# Patient Record
Sex: Male | Born: 1995 | Race: Black or African American | Hispanic: No | Marital: Single | Smoking: Never smoker
Health system: Southern US, Community
[De-identification: ages and names within clinical notes are randomized; demographics above are authoritative.]

## PROBLEM LIST (undated history)

## (undated) DIAGNOSIS — Z973 Presence of spectacles and contact lenses: Secondary | ICD-10-CM

## (undated) DIAGNOSIS — F840 Autistic disorder: Secondary | ICD-10-CM

## (undated) DIAGNOSIS — N200 Calculus of kidney: Secondary | ICD-10-CM

## (undated) HISTORY — DX: Autistic disorder: F84.0

## (undated) HISTORY — DX: Calculus of kidney: N20.0

## (undated) HISTORY — DX: Presence of spectacles and contact lenses: Z97.3

---
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
## 2013-01-12 ENCOUNTER — Ambulatory Visit (INDEPENDENT_AMBULATORY_CARE_PROVIDER_SITE_OTHER): Payer: Managed Care, Other (non HMO) | Admitting: Family Medicine

## 2013-01-12 DIAGNOSIS — Z23 Encounter for immunization: Secondary | ICD-10-CM

## 2014-05-25 ENCOUNTER — Telehealth: Payer: Self-pay | Admitting: Internal Medicine

## 2014-05-25 NOTE — Telephone Encounter (Signed)
yes

## 2014-05-25 NOTE — Telephone Encounter (Signed)
Pt's mom is needing to get pt scheduled for a phy, pt is in school and can only do fridays because he has to come from [place] and he has no classes,. Ok to use a well child slot?

## 2014-05-27 NOTE — Telephone Encounter (Signed)
Left msg for pt to call back and schedule appt

## 2014-05-28 NOTE — Telephone Encounter (Signed)
Pt is scheduled °

## 2014-07-29 ENCOUNTER — Encounter: Payer: Self-pay | Admitting: Internal Medicine

## 2014-07-29 ENCOUNTER — Ambulatory Visit (INDEPENDENT_AMBULATORY_CARE_PROVIDER_SITE_OTHER): Payer: Managed Care, Other (non HMO) | Admitting: Internal Medicine

## 2014-07-29 VITALS — BP 116/76 | Temp 98.3°F | Ht 69.5 in | Wt 164.0 lb

## 2014-07-29 DIAGNOSIS — Z23 Encounter for immunization: Secondary | ICD-10-CM

## 2014-07-29 DIAGNOSIS — Z0000 Encounter for general adult medical examination without abnormal findings: Secondary | ICD-10-CM

## 2014-07-29 NOTE — Patient Instructions (Signed)
  Continue lifestyle intervention healthy eating and exercise . Healthy lifestyle includes : At least 150 minutes of exercise weeks  , weight at healthy levels, which is usually   BMI 19-25. Avoid trans fats and processed foods;  Increase fresh fruits and veges to 5 servings per day. And avoid sweet beverages including tea and juice. Mediterranean diet with olive oil and nuts have been noted to be heart and brain healthy . Avoid tobacco products . Limit  alcohol   Get adequate sleep . Wear seatbelts  Done text and drive.

## 2014-07-29 NOTE — Progress Notes (Signed)
Pre visit review using our clinic review tool, if applicable. No additional management support is needed unless otherwise documented below in the visit note.   Chief Complaint  Patient presents with  . Annual Exam    HPI: Patient comes in today for Preventive Health Care visit   No major change in health status since last visit . 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 Tetanus/tdap  03/27/2013  . Influenza Vaccine  07/04/2015   Health Maintenance Review LIFESTYLE:  Exercise:  Not a lot  Tobacco/ETS:no Alcohol: per day no Sugar beverages: once a day.  Sleep: ok  Drug use: no Drivers license.  No sa   ROS:  GEN/ HEENT: No fever, significant weight changes sweats headaches vision problems hearing changes, CV/ PULM; No chest pain shortness of breath cough, syncope,edema  change in exercise tolerance. GI /GU: No adominal pain, vomiting, change in bowel habits. No blood in the stool. No significant GU symptoms. SKIN/HEME: ,no acute skin rashes suspicious lesions or bleeding. No lymphadenopathy, nodules, masses.  NEURO/ PSYCH:  No neurologic signs such as weakness numbness. No depression anxiety. IMM/ Allergy: No unusual infections.  Allergy .   REST of 12 system review negative except as per HPI   Past Medical History  Diagnosis Date  . Autistic spectrum disorder     prob  . Renal stone 2010    to ed (passed)  . Wears glasses     History reviewed. No pertinent family history.  History   Social History  . Marital Status: Single    Spouse Name: N/A    Number of Children: N/A  . Years of Education: N/A   Social History Main Topics  . Smoking status: Never Smoker   . Smokeless tobacco: None  . Alcohol Use: No  . Drug Use: No  . Sexual Activity: None   Other Topics Concern  . 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   No drivers license.    Neg tad no caffiene    Karate      Living in [place].    In college  [employer] [place] .       No outpatient encounter prescriptions on file as of 07/29/2014.    EXAM:  BP 116/76  Temp(Src) 98.3 F (36.8 C) (Oral)  Ht 5' 9.5" (1.765 m)  Wt 164 lb (74.39 kg)  BMI 23.88 kg/m2  Body mass index is 23.88 kg/(m^2).  Physical Exam: Vital signs reviewed ZOX:WRUE is a well-developed well-nourished alert cooperative    who appearsr stated age in no acute distress.  HEENT: normocephalic atraumatic , Eyes: PERRL EOM's full, conjunctiva clear, Nares: paten,t no deformity discharge or tenderness., Ears: no deformity EAC's 2+ wax TMs with normal landmarks. Mouth: clear OP, no lesions, edema.  Moist mucous membranes. Dentition in adequate repair. NECK: supple without masses, thyromegaly or bruits. CHEST/PULM:  Clear to auscultation and percussion breath sounds equal no wheeze , rales or rhonchi. No chest wall deformities or tenderness. CV: PMI is nondisplaced, S1 S2 no gallops, murmurs, rubs. Peripheral pulses are full without delay.No JVD .  ABDOMEN: Bowel sounds normal nontender  No guard or  rebound, no hepato splenomegal no CVA tenderness.  No hernia. Extremtities:  No clubbing cyanosis or edema, no acute joint swelling or redness no focal atrophy NEURO:  Oriented x3, cranial nerves 3-12 appear to be intact, no obvious focal weakness,gait within normal limits no abnormal reflexes or asymmetrical SKIN: No acute rashes normal turgor, color, no bruising or petechiae. PSYCH: Oriented, good eye contact, no obvious depression anxiety, cognition and judgment appear normal. LN: no cervical axillary inguinal adenopathy  Lab Results  Component Value Date   WBC 6.1 07/11/2012   HGB 15.1 07/11/2012   HCT 45.1 07/11/2012   PLT 203.0 07/11/2012   GLUCOSE 92 07/11/2012    CHOL 160 07/11/2012   TRIG 150.0* 07/11/2012   HDL 42.70 07/11/2012   LDLCALC 87 07/11/2012   ALT 28 07/11/2012   AST 32 07/11/2012   NA 139 07/11/2012   K 4.0 07/11/2012   CL 101 07/11/2012   CREATININE 0.9 07/11/2012   BUN 11 07/11/2012   CO2 28 07/11/2012   TSH 2.15 07/11/2012    ASSESSMENT AND PLAN:  Discussed the following assessment and plan:  Visit for preventive health examination - utd counsled flu vaccine today  Need for prophylactic vaccination and inoculation against influenza - Plan: Flu Vaccine QUAD 36+ mos PF IM (Fluarix Quad PF) ?s answered  Patient Care Team: Madelin Headings, MD as PCP - General Patient Instructions   Continue lifestyle intervention healthy eating and exercise . Healthy lifestyle includes : At least 150 minutes of exercise weeks  , weight at healthy levels, which is usually   BMI 19-25. Avoid trans fats and processed foods;  Increase fresh fruits and veges to 5 servings per day. And avoid sweet beverages including tea and juice. Mediterranean diet with olive oil and nuts have been noted to be heart and brain healthy . Avoid tobacco products . Limit  alcohol   Get adequate sleep . Wear seatbelts  Done text and drive.       Neta Mends. Panosh M.D.

## 2014-07-30 ENCOUNTER — Encounter: Payer: Managed Care, Other (non HMO) | Admitting: Internal Medicine

## 2016-01-18 ENCOUNTER — Ambulatory Visit (INDEPENDENT_AMBULATORY_CARE_PROVIDER_SITE_OTHER): Payer: Managed Care, Other (non HMO) | Admitting: Internal Medicine

## 2016-01-18 ENCOUNTER — Encounter: Payer: Self-pay | Admitting: Internal Medicine

## 2016-01-18 VITALS — BP 122/78 | Temp 98.1°F | Ht 69.0 in | Wt 180.6 lb

## 2016-01-18 DIAGNOSIS — Z0000 Encounter for general adult medical examination without abnormal findings: Secondary | ICD-10-CM | POA: Diagnosis not present

## 2016-01-18 LAB — TSH: TSH: 1.54 u[IU]/mL (ref 0.35–4.50)

## 2016-01-18 LAB — HEPATIC FUNCTION PANEL
ALBUMIN: 4.8 g/dL (ref 3.5–5.2)
ALK PHOS: 68 U/L (ref 39–117)
ALT: 17 U/L (ref 0–53)
AST: 19 U/L (ref 0–37)
Bilirubin, Direct: 0.1 mg/dL (ref 0.0–0.3)
TOTAL PROTEIN: 7.1 g/dL (ref 6.0–8.3)
Total Bilirubin: 0.5 mg/dL (ref 0.2–1.2)

## 2016-01-18 LAB — CBC WITH DIFFERENTIAL/PLATELET
BASOS ABS: 0 10*3/uL (ref 0.0–0.1)
Basophils Relative: 0.5 % (ref 0.0–3.0)
Eosinophils Absolute: 0.3 10*3/uL (ref 0.0–0.7)
Eosinophils Relative: 4.5 % (ref 0.0–5.0)
HEMATOCRIT: 44 % (ref 39.0–52.0)
HEMOGLOBIN: 14.8 g/dL (ref 13.0–17.0)
LYMPHS PCT: 36 % (ref 12.0–46.0)
Lymphs Abs: 2.2 10*3/uL (ref 0.7–4.0)
MCHC: 33.6 g/dL (ref 30.0–36.0)
MCV: 84.5 fl (ref 78.0–100.0)
MONOS PCT: 7 % (ref 3.0–12.0)
Monocytes Absolute: 0.4 10*3/uL (ref 0.1–1.0)
NEUTROS ABS: 3.2 10*3/uL (ref 1.4–7.7)
Neutrophils Relative %: 52 % (ref 43.0–77.0)
PLATELETS: 214 10*3/uL (ref 150.0–400.0)
RBC: 5.22 Mil/uL (ref 4.22–5.81)
RDW: 13.2 % (ref 11.5–15.5)
WBC: 6.1 10*3/uL (ref 4.0–10.5)

## 2016-01-18 LAB — LIPID PANEL
CHOL/HDL RATIO: 4
Cholesterol: 153 mg/dL (ref 0–200)
HDL: 35.9 mg/dL — ABNORMAL LOW (ref >39.00)
LDL Cholesterol: 97 mg/dL (ref 0–99)
NONHDL: 117.17
Triglycerides: 103 mg/dL (ref 0.0–149.0)
VLDL: 20.6 mg/dL (ref 0.0–40.0)

## 2016-01-18 LAB — BASIC METABOLIC PANEL
BUN: 12 mg/dL (ref 6–23)
CALCIUM: 9.7 mg/dL (ref 8.4–10.5)
CO2: 30 meq/L (ref 19–32)
Chloride: 103 mEq/L (ref 96–112)
Creatinine, Ser: 0.98 mg/dL (ref 0.40–1.50)
GFR: 123.22 mL/min (ref >60.00)
Glucose, Bld: 91 mg/dL (ref 70–99)
Potassium: 4.2 mEq/L (ref 3.5–5.1)
SODIUM: 139 meq/L (ref 135–145)

## 2016-01-18 NOTE — Progress Notes (Signed)
Pre visit review using our clinic review tool, if applicable. No additional management support is needed unless otherwise documented below in the visit note.  Chief Complaint  Patient presents with  . Annual Exam    HPI: Patient  Ricardo Parker  20 y.o. comes in today for Preventive Health Care visit  Now graduated from arts school [place] at home again. Health Maintenance  Topic Date Due  . HIV Screening  03/27/2009  . TETANUS/TDAP  03/27/2013  . INFLUENZA VACCINE  07/03/2016   Health Maintenance Review LIFESTYLE:  Exercise:   Works out  y  In weekdays   Tobacco/ETS: no Alcohol: rare Sugar beverages: not daily.  Sleep: 10-8  Drug use: no SA no  ROS:  GEN/ HEENT: No fever, significant weight changes sweats headaches vision problems hearing changes, CV/ PULM; No chest pain shortness of breath cough, syncope,edema  change in exercise tolerance. GI /GU: No adominal pain, vomiting, change in bowel habits. No blood in the stool. No significant GU symptoms. SKIN/HEME: ,no acute skin rashes suspicious lesions or bleeding. No lymphadenopathy, nodules, masses.  NEURO/ PSYCH:  No neurologic signs such as weakness numbness. No depression anxiety. IMM/ Allergy: No unusual infections.  Allergy .   REST of 12 system review negative except as per HPI   Past Medical History  Diagnosis Date  . Autistic spectrum disorder     prob  . Renal stone 2010    to ed (passed)  . Wears glasses     No past surgical history on file.  No family history on file.  Social History   Social History  . Marital Status: Single    Spouse Name: N/A  . Number of Children: N/A  . Years of Education: N/A   Social History Main Topics  . Smoking status: Never Smoker   . Smokeless tobacco: None  . Alcohol Use: No  . Drug Use: No  . Sexual Activity: Not Asked   Other Topics Concern  . None   Social History Narrative   HH of 5  Oldest child Father hx oxf deployed   Pet dog and fish   Sleep Ok some night   Wears glasses   [employer]  b and Chemical engineer.    drivers license.    Neg tad no caffiene    Karate      Living in [place].    In college  [employer] [place] .    Graduated    Now at home hh of 5   Works out 5 d per week 1.75  hours    No outpatient prescriptions prior to visit.   No facility-administered medications prior to visit.     EXAM:  BP 122/78 mmHg  Temp(Src) 98.1 F (36.7 C) (Oral)  Ht  (1.753 m)  Wt 180 lb 9.6 oz (81.92 kg)  BMI 26.66 kg/m2  Body mass index is 26.66 kg/(m^2).  Physical Exam: Vital signs reviewed ZOX:WRUE is a well-developed well-nourished alert cooperative    who appearsr stated age in no acute distress.  HEENT: normocephalic atraumatic , Eyes: PERRL EOM's full, conjunctiva clearglassess, Nares: paten,t no deformity discharge or tenderness., Ears: no deformity EAC's clear TMs with normal landmarks. Mouth: clear OP, no lesions, edema.  Moist mucous membranes. Dentition in adequate repair. NECK: supple without masses, thyromegaly or bruits. CHEST/PULM:  Clear to auscultation and percussion breath sounds equal no wheeze , rales or rhonchi. No chest wall deformities or tenderness. CV: PMI is nondisplaced, S1  S2 no gallops, murmurs, rubs. Peripheral pulses are full without delay.No JVD .  ABDOMEN: Bowel sounds normal nontender  No guard or rebound, no hepato splenomegal no CVA tenderness.  No hernia. Extremtities:  No clubbing cyanosis or edema, no acute joint swelling or redness no focal atrophy good muscle mass  NEURO:  Oriented x3, cranial nerves 3-12 appear to be intact, no obvious focal weakness,gait within normal limits no abnormal reflexes or asymmetrical SKIN: No acute rashes normal turgor, color, no bruising or petechiae. PSYCH: Oriented, good eye contact, no obvious depression anxiety, cognition and judgment appear normal. Slightly flat  Speech but interactive nl conversaion LN: no cervical axillary inguinal  adenopathy   ASSESSMENT AND PLAN:  Discussed the following assessment and plan:  Visit for preventive health examination - Plan: Basic metabolic panel, CBC with Differential/Platelet, Hepatic function panel, Lipid panel, TSH Disc lab today and if ok  Periodic  Lab  But Pv in a year  Counseled regarding healthy nutrition, exercise, sleep, injury prevention,  Patient Care Team: Madelin Headings, MD as PCP - General Patient Instructions   Continue lifestyle intervention healthy eating and exercise . Mediterranean diet is the healthiest. Will notify you  of labs when available. If all ok then yearly check up Health Maintenance, Male A healthy lifestyle and preventative care can promote health and wellness.  Maintain regular health, dental, and eye exams.  Eat a healthy diet. Foods like vegetables, fruits, whole grains, low-fat dairy products, and lean protein foods contain the nutrients you need and are low in calories. Decrease your intake of foods high in solid fats, added sugars, and salt. Get information about a proper diet from your health care provider, if necessary.  Regular physical exercise is one of the most important things you can do for your health. Most adults should get at least 150 minutes of moderate-intensity exercise (any activity that increases your heart rate and causes you to sweat) each week. In addition, most adults need muscle-strengthening exercises on 2 or more days a week.   Maintain a healthy weight. The body mass index (BMI) is a screening tool to identify possible weight problems. It provides an estimate of body fat based on height and weight. Your health care provider can find your BMI and can help you achieve or maintain a healthy weight. For males 20 years and older:  A BMI below 18.5 is considered underweight.  A BMI of 18.5 to 24.9 is normal.  A BMI of 25 to 29.9 is considered overweight.  A BMI of 30 and above is considered obese.  Maintain normal  blood lipids and cholesterol by exercising and minimizing your intake of saturated fat. Eat a balanced diet with plenty of fruits and vegetables. Blood tests for lipids and cholesterol should begin at age 72 and be repeated every 5 years. If your lipid or cholesterol levels are high, you are over age 53, or you are at high risk for heart disease, you may need your cholesterol levels checked more frequently.Ongoing high lipid and cholesterol levels should be treated with medicines if diet and exercise are not working.  If you smoke, find out from your health care provider how to quit. If you do not use tobacco, do not start.  Lung cancer screening is recommended for adults aged 55-80 years who are at high risk for developing lung cancer because of a history of smoking. A yearly low-dose CT scan of the lungs is recommended for people who have at least a  30-pack-year history of smoking and are current smokers or have quit within the past 15 years. A pack year of smoking is smoking an average of 1 pack of cigarettes a day for 1 year (for example, a 30-pack-year history of smoking could mean smoking 1 pack a day for 30 years or 2 packs a day for 15 years). Yearly screening should continue until the smoker has stopped smoking for at least 15 years. Yearly screening should be stopped for people who develop a health problem that would prevent them from having lung cancer treatment.  If you choose to drink alcohol, do not have more than 2 drinks per day. One drink is considered to be 12 oz (360 mL) of beer, 5 oz (150 mL) of wine, or 1.5 oz (45 mL) of liquor.  Avoid the use of street drugs. Do not share needles with anyone. Ask for help if you need support or instructions about stopping the use of drugs.  High blood pressure causes heart disease and increases the risk of stroke. High blood pressure is more likely to develop in:  People who have blood pressure in the end of the normal range (100-139/85-89 mm  Hg).  People who are overweight or obese.  People who are African American.  If you are 5-27 years of age, have your blood pressure checked every 3-5 years. If you are 16 years of age or older, have your blood pressure checked every year. You should have your blood pressure measured twice--once when you are at a hospital or clinic, and once when you are not at a hospital or clinic. Record the average of the two measurements. To check your blood pressure when you are not at a hospital or clinic, you can use:  An automated blood pressure machine at a pharmacy.  A home blood pressure monitor.  If you are 12-35 years old, ask your health care provider if you should take aspirin to prevent heart disease.  Diabetes screening involves taking a blood sample to check your fasting blood sugar level. This should be done once every 3 years after age 27 if you are at a normal weight and without risk factors for diabetes. Testing should be considered at a younger age or be carried out more frequently if you are overweight and have at least 1 risk factor for diabetes.  Colorectal cancer can be detected and often prevented. Most routine colorectal cancer screening begins at the age of 82 and continues through age 38. However, your health care provider may recommend screening at an earlier age if you have risk factors for colon cancer. On a yearly basis, your health care provider may provide home test kits to check for hidden blood in the stool. A small camera at the end of a tube may be used to directly examine the colon (sigmoidoscopy or colonoscopy) to detect the earliest forms of colorectal cancer. Talk to your health care provider about this at age 66 when routine screening begins. A direct exam of the colon should be repeated every 5-10 years through age 82, unless early forms of precancerous polyps or small growths are found.  People who are at an increased risk for hepatitis B should be screened for this  virus. You are considered at high risk for hepatitis B if:  You were born in a country where hepatitis B occurs often. Talk with your health care provider about which countries are considered high risk.  Your parents were born in a high-risk country and  you have not received a shot to protect against hepatitis B (hepatitis B vaccine).  You have HIV or AIDS.  You use needles to inject street drugs.  You live with, or have sex with, someone who has hepatitis B.  You are a man who has sex with other men (MSM).  You get hemodialysis treatment.  You take certain medicines for conditions like cancer, organ transplantation, and autoimmune conditions.  Hepatitis C blood testing is recommended for all people born from [birth year] through 1967 and any individual with known risk factors for hepatitis C.  Healthy men should no longer receive prostate-specific antigen (PSA) blood tests as part of routine cancer screening. Talk to your health care provider about prostate cancer screening.  Testicular cancer screening is not recommended for adolescents or adult males who have no symptoms. Screening includes self-exam, a health care provider exam, and other screening tests. Consult with your health care provider about any symptoms you have or any concerns you have about testicular cancer.  Practice safe sex. Use condoms and avoid high-risk sexual practices to reduce the spread of sexually transmitted infections (STIs).  You should be screened for STIs, including gonorrhea and chlamydia if:  You are sexually active and are younger than 24 years.  You are older than 24 years, and your health care provider tells you that you are at risk for this type of infection.  Your sexual activity has changed since you were last screened, and you are at an increased risk for chlamydia or gonorrhea. Ask your health care provider if you are at risk.  If you are at risk of being infected with HIV, it is recommended that  you take a prescription medicine daily to prevent HIV infection. This is called pre-exposure prophylaxis (PrEP). You are considered at risk if:  You are a man who has sex with other men (MSM).  You are a heterosexual man who is sexually active with multiple partners.  You take drugs by injection.  You are sexually active with a partner who has HIV.  Talk with your health care provider about whether you are at high risk of being infected with HIV. If you choose to begin PrEP, you should first be tested for HIV. You should then be tested every 3 months for as long as you are taking PrEP.  Use sunscreen. Apply sunscreen liberally and repeatedly throughout the day. You should seek shade when your shadow is shorter than you. Protect yourself by wearing long sleeves, pants, a wide-brimmed hat, and sunglasses year round whenever you are outdoors.  Tell your health care provider of new moles or changes in moles, especially if there is a change in shape or color. Also, tell your health care provider if a mole is larger than the size of a pencil eraser.  A one-time screening for abdominal aortic aneurysm (AAA) and surgical repair of large AAAs by ultrasound is recommended for men aged 65-75 years who are current or former smokers.  Stay current with your vaccines (immunizations).   This information is not intended to replace advice given to you by your health care provider. Make sure you discuss any questions you have with your health care provider.   Document Released: 05/17/2008 Document Revised: 12/10/2014 Document Reviewed: 04/16/2011 Elsevier Interactive Patient Education [employer]     [place]. Panosh M.D.

## 2016-01-18 NOTE — Patient Instructions (Addendum)
Continue lifestyle intervention healthy eating and exercise . Mediterranean diet is the healthiest. Will notify you  of labs when available. If all ok then yearly check up Health Maintenance, Male A healthy lifestyle and preventative care can promote health and wellness.  Maintain regular health, dental, and eye exams.  Eat a healthy diet. Foods like vegetables, fruits, whole grains, low-fat dairy products, and lean protein foods contain the nutrients you need and are low in calories. Decrease your intake of foods high in solid fats, added sugars, and salt. Get information about a proper diet from your health care provider, if necessary.  Regular physical exercise is one of the most important things you can do for your health. Most adults should get at least 150 minutes of moderate-intensity exercise (any activity that increases your heart rate and causes you to sweat) each week. In addition, most adults need muscle-strengthening exercises on 2 or more days a week.   Maintain a healthy weight. The body mass index (BMI) is a screening tool to identify possible weight problems. It provides an estimate of body fat based on height and weight. Your health care provider can find your BMI and can help you achieve or maintain a healthy weight. For males 20 years and older:  A BMI below 18.5 is considered underweight.  A BMI of 18.5 to 24.9 is normal.  A BMI of 25 to 29.9 is considered overweight.  A BMI of 30 and above is considered obese.  Maintain normal blood lipids and cholesterol by exercising and minimizing your intake of saturated fat. Eat a balanced diet with plenty of fruits and vegetables. Blood tests for lipids and cholesterol should begin at age 50 and be repeated every 5 years. If your lipid or cholesterol levels are high, you are over age 25, or you are at high risk for heart disease, you may need your cholesterol levels checked more frequently.Ongoing high lipid and cholesterol  levels should be treated with medicines if diet and exercise are not working.  If you smoke, find out from your health care provider how to quit. If you do not use tobacco, do not start.  Lung cancer screening is recommended for adults aged 55-80 years who are at high risk for developing lung cancer because of a history of smoking. A yearly low-dose CT scan of the lungs is recommended for people who have at least a 30-pack-year history of smoking and are current smokers or have quit within the past 15 years. A pack year of smoking is smoking an average of 1 pack of cigarettes a day for 1 year (for example, a 30-pack-year history of smoking could mean smoking 1 pack a day for 30 years or 2 packs a day for 15 years). Yearly screening should continue until the smoker has stopped smoking for at least 15 years. Yearly screening should be stopped for people who develop a health problem that would prevent them from having lung cancer treatment.  If you choose to drink alcohol, do not have more than 2 drinks per day. One drink is considered to be 12 oz (360 mL) of beer, 5 oz (150 mL) of wine, or 1.5 oz (45 mL) of liquor.  Avoid the use of street drugs. Do not share needles with anyone. Ask for help if you need support or instructions about stopping the use of drugs.  High blood pressure causes heart disease and increases the risk of stroke. High blood pressure is more likely to develop in:  People  who have blood pressure in the end of the normal range (100-139/85-89 mm Hg).  People who are overweight or obese.  People who are African American.  If you are 71-77 years of age, have your blood pressure checked every 3-5 years. If you are 57 years of age or older, have your blood pressure checked every year. You should have your blood pressure measured twice--once when you are at a hospital or clinic, and once when you are not at a hospital or clinic. Record the average of the two measurements. To check your  blood pressure when you are not at a hospital or clinic, you can use:  An automated blood pressure machine at a pharmacy.  A home blood pressure monitor.  If you are 92-24 years old, ask your health care provider if you should take aspirin to prevent heart disease.  Diabetes screening involves taking a blood sample to check your fasting blood sugar level. This should be done once every 3 years after age 26 if you are at a normal weight and without risk factors for diabetes. Testing should be considered at a younger age or be carried out more frequently if you are overweight and have at least 1 risk factor for diabetes.  Colorectal cancer can be detected and often prevented. Most routine colorectal cancer screening begins at the age of 58 and continues through age 83. However, your health care provider may recommend screening at an earlier age if you have risk factors for colon cancer. On a yearly basis, your health care provider may provide home test kits to check for hidden blood in the stool. A small camera at the end of a tube may be used to directly examine the colon (sigmoidoscopy or colonoscopy) to detect the earliest forms of colorectal cancer. Talk to your health care provider about this at age 66 when routine screening begins. A direct exam of the colon should be repeated every 5-10 years through age 30, unless early forms of precancerous polyps or small growths are found.  People who are at an increased risk for hepatitis B should be screened for this virus. You are considered at high risk for hepatitis B if:  You were born in a country where hepatitis B occurs often. Talk with your health care provider about which countries are considered high risk.  Your parents were born in a high-risk country and you have not received a shot to protect against hepatitis B (hepatitis B vaccine).  You have HIV or AIDS.  You use needles to inject street drugs.  You live with, or have sex with,  someone who has hepatitis B.  You are a man who has sex with other men (MSM).  You get hemodialysis treatment.  You take certain medicines for conditions like cancer, organ transplantation, and autoimmune conditions.  Hepatitis C blood testing is recommended for all people born from [birth year] through 1965 and any individual with known risk factors for hepatitis C.  Healthy men should no longer receive prostate-specific antigen (PSA) blood tests as part of routine cancer screening. Talk to your health care provider about prostate cancer screening.  Testicular cancer screening is not recommended for adolescents or adult males who have no symptoms. Screening includes self-exam, a health care provider exam, and other screening tests. Consult with your health care provider about any symptoms you have or any concerns you have about testicular cancer.  Practice safe sex. Use condoms and avoid high-risk sexual practices to reduce the spread of sexually  transmitted infections (STIs).  You should be screened for STIs, including gonorrhea and chlamydia if:  You are sexually active and are younger than 24 years.  You are older than 24 years, and your health care provider tells you that you are at risk for this type of infection.  Your sexual activity has changed since you were last screened, and you are at an increased risk for chlamydia or gonorrhea. Ask your health care provider if you are at risk.  If you are at risk of being infected with HIV, it is recommended that you take a prescription medicine daily to prevent HIV infection. This is called pre-exposure prophylaxis (PrEP). You are considered at risk if:  You are a man who has sex with other men (MSM).  You are a heterosexual man who is sexually active with multiple partners.  You take drugs by injection.  You are sexually active with a partner who has HIV.  Talk with your health care provider about whether you are at high risk of being  infected with HIV. If you choose to begin PrEP, you should first be tested for HIV. You should then be tested every 3 months for as long as you are taking PrEP.  Use sunscreen. Apply sunscreen liberally and repeatedly throughout the day. You should seek shade when your shadow is shorter than you. Protect yourself by wearing long sleeves, pants, a wide-brimmed hat, and sunglasses year round whenever you are outdoors.  Tell your health care provider of new moles or changes in moles, especially if there is a change in shape or color. Also, tell your health care provider if a mole is larger than the size of a pencil eraser.  A one-time screening for abdominal aortic aneurysm (AAA) and surgical repair of large AAAs by ultrasound is recommended for men aged 65-75 years who are current or former smokers.  Stay current with your vaccines (immunizations).   This information is not intended to replace advice given to you by your health care provider. Make sure you discuss any questions you have with your health care provider.   Document Released: 05/17/2008 Document Revised: 12/10/2014 Document Reviewed: 04/16/2011 Elsevier Interactive Patient Education [employer]2016 Elsevier Inc.

## 2018-03-05 ENCOUNTER — Encounter: Payer: Self-pay | Admitting: Physician Assistant

## 2018-03-18 ENCOUNTER — Ambulatory Visit: Payer: Self-pay | Admitting: Physician Assistant

## 2018-03-24 ENCOUNTER — Ambulatory Visit (INDEPENDENT_AMBULATORY_CARE_PROVIDER_SITE_OTHER): Payer: 59 | Admitting: Physician Assistant

## 2018-03-24 ENCOUNTER — Encounter: Payer: Self-pay | Admitting: Physician Assistant

## 2018-03-24 ENCOUNTER — Other Ambulatory Visit: Payer: Self-pay

## 2018-03-24 VITALS — BP 146/78 | HR 89 | Temp 98.1°F | Resp 16 | Ht 69.0 in | Wt 194.0 lb

## 2018-03-24 DIAGNOSIS — Z13228 Encounter for screening for other metabolic disorders: Secondary | ICD-10-CM

## 2018-03-24 DIAGNOSIS — Z130 Encounter for screening for diseases of the blood and blood-forming organs and certain disorders involving the immune mechanism: Secondary | ICD-10-CM | POA: Diagnosis not present

## 2018-03-24 DIAGNOSIS — Z0000 Encounter for general adult medical examination without abnormal findings: Secondary | ICD-10-CM

## 2018-03-24 DIAGNOSIS — Z113 Encounter for screening for infections with a predominantly sexual mode of transmission: Secondary | ICD-10-CM | POA: Diagnosis not present

## 2018-03-24 DIAGNOSIS — Z1329 Encounter for screening for other suspected endocrine disorder: Secondary | ICD-10-CM

## 2018-03-24 DIAGNOSIS — R350 Frequency of micturition: Secondary | ICD-10-CM

## 2018-03-24 LAB — POCT URINALYSIS DIP (MANUAL ENTRY)
Bilirubin, UA: NEGATIVE
Glucose, UA: NEGATIVE mg/dL
Ketones, POC UA: NEGATIVE mg/dL
Leukocytes, UA: NEGATIVE
NITRITE UA: NEGATIVE
PH UA: 7.5 (ref 5.0–8.0)
Protein Ur, POC: NEGATIVE mg/dL
RBC UA: NEGATIVE
Spec Grav, UA: 1.02 (ref 1.010–1.025)
Urobilinogen, UA: 1 E.U./dL

## 2018-03-24 NOTE — Progress Notes (Signed)
PRIMARY CARE AT [place], [place] [address] [phone]  Date:  03/24/2018   Name:  ZEDDIE NJIE   DOB:  12/27/1995   MRN:  [id number]  PCP:  Joretta Bachelor, PA    History of Present Illness:  Verdie Drown Rebuck is a 22 y.o. male patient who presents to PCP with  Chief Complaint  Patient presents with  . Annual Exam     DIET: meats, pastas.  He hydrates well with water.  He is eating vegetables.  No sodas.    BM: normal.  No known black or bloody stool.  No constipaiton or diarrhea.    URINATION: normal.  No dysuria, hematuria, but has some frequency  frequency.    SOCIAL ACTIVITY: he mostly makes short films, also enjoys writing scripts for the films.   Exercise: 3 times per week.  Basic and jogging, and weightlifting.   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  He likes his job.   EtOH: none Tobacco or vaping: none Illicit drug use: none Sexually active: not at this time.    Patient Active Problem List   Diagnosis Date Noted  . Head pain 07/14/2012  . Visit for preventive health examination 07/14/2012  . Autistic spectrum disorder   . Wears glasses   . 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 EPISTAXIS, RECURRENT 08/27/2008    Past Medical History:  Diagnosis Date  . Autistic spectrum disorder    prob  . Renal stone 2010   to ed (passed)  . Wears glasses     No past surgical history on file.  Social History   Tobacco Use  . Smoking status: Never Smoker  Substance Use Topics  . Alcohol use: No  . Drug use: No    No family history on file.  No Known Allergies  Medication list has been reviewed and updated.  No current outpatient medications on file prior to visit.   No current facility-administered medications on file prior to visit.     Review of Systems  Constitutional: Negative for chills and fever.  HENT: Negative for ear discharge, ear pain and sore throat.   Eyes: Negative for blurred vision and double  vision.  Respiratory: Negative for cough, shortness of breath and wheezing.   Cardiovascular: Negative for chest pain, palpitations and leg swelling.  Gastrointestinal: Negative for diarrhea, nausea and vomiting.  Genitourinary: Negative for dysuria, frequency and hematuria.  Skin: Negative for itching and rash.  Neurological: Negative for dizziness and headaches.   ROS otherwise unremarkable unless listed above.  Physical Examination: BP (!) 146/78   Pulse 89   Temp 98.1 F (36.7 C) (Oral)   Resp 16   Ht _0  (1.753 m)   Wt 194 lb (88 kg)   SpO2 100%   BMI 28.65 kg/m  Ideal Body Weight: Weight in (lb) to have BMI = 25: 168.9  Physical Exam  Constitutional: He is oriented to person, place, and time. He appears well-developed and well-nourished. No distress.  HENT:  Head: Normocephalic and atraumatic.  Right Ear: Tympanic membrane, external ear and ear canal normal.  Left Ear: Tympanic membrane, external ear and ear canal normal.  Eyes: Pupils are equal, round, and reactive to light. Conjunctivae and EOM are normal.  Cardiovascular: Normal rate and regular rhythm. Exam reveals no friction rub.  No murmur heard. Pulmonary/Chest: Effort normal. No respiratory distress. He has no wheezes.  Abdominal: Soft. Bowel sounds are normal. He exhibits no distension and  no mass. There is no tenderness.  Musculoskeletal: Normal range of motion. He exhibits no edema or tenderness.  Neurological: He is alert and oriented to person, place, and time. He displays normal reflexes.  Skin: Skin is warm and dry. He is not diaphoretic.  Psychiatric: He has a normal mood and affect. His behavior is normal.     Assessment and Plan: Verdie Drown Mccollom is a 22 y.o. male who is here today for cc of  Chief Complaint  Patient presents with  . Annual Exam  --urine normal Annual physical exam - Plan: CBC, CMP14+EGFR, TSH, HIV antibody, RPR  Frequency of urination - Plan: POCT urinalysis  dipstick  Screening for deficiency anemia - Plan: CBC  Screening for metabolic disorder - Plan: CMP14+EGFR  Screening for thyroid disorder - Plan: TSH  Screening for STD (sexually transmitted disease) - Plan: HIV antibody, RPR  Ivar Drape, PA-C [facility] 4/24/20197:52 AM

## 2018-03-25 ENCOUNTER — Encounter: Payer: Self-pay | Admitting: Urgent Care

## 2018-03-25 ENCOUNTER — Ambulatory Visit (INDEPENDENT_AMBULATORY_CARE_PROVIDER_SITE_OTHER): Payer: 59 | Admitting: Urgent Care

## 2018-03-25 VITALS — HR 70 | Resp 16

## 2018-03-25 DIAGNOSIS — R350 Frequency of micturition: Secondary | ICD-10-CM

## 2018-03-25 LAB — CMP14+EGFR
ALBUMIN: 4.9 g/dL (ref 3.5–5.5)
ALK PHOS: 73 IU/L (ref 39–117)
ALT: 38 IU/L (ref 0–44)
AST: 28 IU/L (ref 0–40)
Albumin/Globulin Ratio: 2 (ref 1.2–2.2)
BILIRUBIN TOTAL: 0.3 mg/dL (ref 0.0–1.2)
BUN / CREAT RATIO: 9 (ref 9–20)
BUN: 10 mg/dL (ref 6–20)
CHLORIDE: 99 mmol/L (ref 96–106)
CO2: 23 mmol/L (ref 20–29)
CREATININE: 1.07 mg/dL (ref 0.76–1.27)
Calcium: 10 mg/dL (ref 8.7–10.2)
GFR calc Af Amer: 113 mL/min/{1.73_m2} (ref >59)
GFR calc non Af Amer: 97 mL/min/{1.73_m2} (ref >59)
GLUCOSE: 91 mg/dL (ref 65–99)
Globulin, Total: 2.5 g/dL (ref 1.5–4.5)
Potassium: 3.9 mmol/L (ref 3.5–5.2)
Sodium: 141 mmol/L (ref 134–144)
Total Protein: 7.4 g/dL (ref 6.0–8.5)

## 2018-03-25 LAB — CBC
Hematocrit: 44.7 % (ref 37.5–51.0)
Hemoglobin: 15.2 g/dL (ref 13.0–17.7)
MCH: 27.9 pg (ref 26.6–33.0)
MCHC: 34 g/dL (ref 31.5–35.7)
MCV: 82 fL (ref 79–97)
PLATELETS: 267 10*3/uL (ref 150–379)
RBC: 5.45 x10E6/uL (ref 4.14–5.80)
RDW: 13 % (ref 12.3–15.4)
WBC: 7.6 10*3/uL (ref 3.4–10.8)

## 2018-03-25 LAB — RPR: RPR: NONREACTIVE

## 2018-03-25 LAB — TSH: TSH: 4.02 u[IU]/mL (ref 0.450–4.500)

## 2018-03-25 LAB — HIV ANTIBODY (ROUTINE TESTING W REFLEX): HIV SCREEN 4TH GENERATION: NONREACTIVE

## 2018-03-25 NOTE — Patient Instructions (Addendum)
Health Maintenance, Male A healthy lifestyle and preventive care is important for your health and wellness. Ask your health care provider about what schedule of regular examinations is right for you. What should I know about weight and diet? Eat a Healthy Diet  Eat plenty of vegetables, fruits, whole grains, low-fat dairy products, and lean protein.  Do not eat a lot of foods high in solid fats, added sugars, or salt.  Maintain a Healthy Weight Regular exercise can help you achieve or maintain a healthy weight. You should:  Do at least 150 minutes of exercise each week. The exercise should increase your heart rate and make you sweat (moderate-intensity exercise).  Do strength-training exercises at least twice a week.  Watch Your Levels of Cholesterol and Blood Lipids  Have your blood tested for lipids and cholesterol every 5 years starting at 24 years of age. If you are at high risk for heart disease, you should start having your blood tested when you are 24 years old. You may need to have your cholesterol levels checked more often if: ? Your lipid or cholesterol levels are high. ? You are older than 24 years of age. ? You are at high risk for heart disease.  What should I know about cancer screening? Many types of cancers can be detected early and may often be prevented. Lung Cancer  You should be screened every year for lung cancer if: ? You are a current smoker who has smoked for at least 30 years. ? You are a former smoker who has quit within the past 15 years.  Talk to your health care provider about your screening options, when you should start screening, and how often you should be screened.  Colorectal Cancer  Routine colorectal cancer screening usually begins at 24 years of age and should be repeated every 5-10 years until you are 24 years old. You may need to be screened more often if early forms of precancerous polyps or small growths are found. Your health care provider  may recommend screening at an earlier age if you have risk factors for colon cancer.  Your health care provider may recommend using home test kits to check for hidden blood in the stool.  A small camera at the end of a tube can be used to examine your colon (sigmoidoscopy or colonoscopy). This checks for the earliest forms of colorectal cancer.  Prostate and Testicular Cancer  Depending on your age and overall health, your health care provider may do certain tests to screen for prostate and testicular cancer.  Talk to your health care provider about any symptoms or concerns you have about testicular or prostate cancer.  Skin Cancer  Check your skin from head to toe regularly.  Tell your health care provider about any new moles or changes in moles, especially if: ? There is a change in a mole's size, shape, or color. ? You have a mole that is larger than a pencil eraser.  Always use sunscreen. Apply sunscreen liberally and repeat throughout the day.  Protect yourself by wearing long sleeves, pants, a wide-brimmed hat, and sunglasses when outside.  What should I know about heart disease, diabetes, and high blood pressure?  If you are 18-39 years of age, have your blood pressure checked every 3-5 years. If you are 40 years of age or older, have your blood pressure checked every year. You should have your blood pressure measured twice-once when you are at a hospital or clinic, and once when   you are not at a hospital or clinic. Record the average of the two measurements. To check your blood pressure when you are not at a hospital or clinic, you can use: ? An automated blood pressure machine at a pharmacy. ? A home blood pressure monitor.  Talk to your health care provider about your target blood pressure.  If you are between 45-79 years old, ask your health care provider if you should take aspirin to prevent heart disease.  Have regular diabetes screenings by checking your fasting blood  sugar level. ? If you are at a normal weight and have a low risk for diabetes, have this test once every three years after the age of 45. ? If you are overweight and have a high risk for diabetes, consider being tested at a younger age or more often.  A one-time screening for abdominal aortic aneurysm (AAA) by ultrasound is recommended for men aged 65-75 years who are current or former smokers. What should I know about preventing infection? Hepatitis B If you have a higher risk for hepatitis B, you should be screened for this virus. Talk with your health care provider to find out if you are at risk for hepatitis B infection. Hepatitis C Blood testing is recommended for:  Everyone born from 1945 through 1965.  Anyone with known risk factors for hepatitis C.  Sexually Transmitted Diseases (STDs)  You should be screened each year for STDs including gonorrhea and chlamydia if: ? You are sexually active and are younger than 24 years of age. ? You are older than 24 years of age and your health care provider tells you that you are at risk for this type of infection. ? Your sexual activity has changed since you were last screened and you are at an increased risk for chlamydia or gonorrhea. Ask your health care provider if you are at risk.  Talk with your health care provider about whether you are at high risk of being infected with HIV. Your health care provider may recommend a prescription medicine to help prevent HIV infection.  What else can I do?  Schedule regular health, dental, and eye exams.  Stay current with your vaccines (immunizations).  Do not use any tobacco products, such as cigarettes, chewing tobacco, and e-cigarettes. If you need help quitting, ask your health care provider.  Limit alcohol intake to no more than 2 drinks per day. One drink equals 12 ounces of beer, 5 ounces of wine, or 1 ounces of hard liquor.  Do not use street drugs.  Do not share needles.  Ask your  health care provider for help if you need support or information about quitting drugs.  Tell your health care provider if you often feel depressed.  Tell your health care provider if you have ever been abused or do not feel safe at home. This information is not intended to replace advice given to you by your health care provider. Make sure you discuss any questions you have with your health care provider. Document Released: 05/17/2008 Document Revised: 07/18/2016 Document Reviewed: 08/23/2015 Elsevier Interactive Patient Education  2018 Elsevier Inc.     IF you received an x-ray today, you will receive an invoice from [facility]. Please contact [facility] at [phone] with questions or concerns regarding your invoice.   IF you received labwork today, you will receive an invoice from [facility]. Please contact [facility] at [phone] with questions or concerns regarding your invoice.   Our billing staff will not be   able to assist you with questions regarding bills from these companies.  You will be contacted with the lab results as soon as they are available. The fastest way to get your results is to activate your My Chart account. Instructions are located on the last page of this paperwork. If you have not heard from us regarding the results in 2 weeks, please contact this office.       

## 2018-03-25 NOTE — Progress Notes (Signed)
MRN: [id number] DOB: [birth year]  Subjective:   Ricardo Parker is a 22 y.o. male presenting for follow up on lab results.  Patient believes that he was told by PA English to return today for follow-up.  Denies any concerns.  He did have an annual physical yesterday.  Ricardo Parker currently has no medications in their medication list. Also has No Known Allergies.  Ricardo Parker  has a past medical history of Autistic spectrum disorder, Renal stone (2010), and Wears glasses. Also  has no past surgical history on file.  Objective:   Vitals: Pulse 70   Resp 16   SpO2 99%   Physical Exam  Constitutional: He is oriented to person, place, and time. He appears well-developed and well-nourished.  Cardiovascular: Normal rate.  Pulmonary/Chest: Effort normal.  Neurological: He is alert and oriented to person, place, and time.    Results for orders placed or performed in visit on 03/24/18 (from the past 72 hour(s))  CBC     Status: None   Collection Time: 03/24/18  5:41 PM  Result Value Ref Range   WBC 7.6 3.4 - 10.8 x10E3/uL   RBC 5.45 4.14 - 5.80 x10E6/uL   Hemoglobin 15.2 13.0 - 17.7 g/dL   Hematocrit 44.7 37.5 - 51.0 %   MCV 82 79 - 97 fL   MCH 27.9 26.6 - 33.0 pg   MCHC 34.0 31.5 - 35.7 g/dL   RDW 13.0 12.3 - 15.4 %   Platelets 267 150 - 379 x10E3/uL  CMP14+EGFR     Status: None   Collection Time: 03/24/18  5:41 PM  Result Value Ref Range   Glucose 91 65 - 99 mg/dL   BUN 10 6 - 20 mg/dL   Creatinine, Ser 1.07 0.76 - 1.27 mg/dL   GFR calc non Af Amer 97 >59 mL/min/1.73   GFR calc Af Amer 113 >59 mL/min/1.73   BUN/Creatinine Ratio 9 9 - 20   Sodium 141 134 - 144 mmol/L   Potassium 3.9 3.5 - 5.2 mmol/L   Chloride 99 96 - 106 mmol/L   CO2 23 20 - 29 mmol/L   Calcium 10.0 8.7 - 10.2 mg/dL   Total Protein 7.4 6.0 - 8.5 g/dL   Albumin 4.9 3.5 - 5.5 g/dL   Globulin, Total 2.5 1.5 - 4.5 g/dL   Albumin/Globulin Ratio 2.0 1.2 - 2.2   Bilirubin Total 0.3 0.0 - 1.2 mg/dL   Alkaline  Phosphatase 73 39 - 117 IU/L   AST 28 0 - 40 IU/L   ALT 38 0 - 44 IU/L  TSH     Status: None   Collection Time: 03/24/18  5:41 PM  Result Value Ref Range   TSH 4.020 0.450 - 4.500 uIU/mL  HIV antibody     Status: None   Collection Time: 03/24/18  5:41 PM  Result Value Ref Range   HIV Screen 4th Generation wRfx Non Reactive Non Reactive  RPR     Status: None   Collection Time: 03/24/18  5:41 PM  Result Value Ref Range   RPR Ser Ql Non Reactive Non Reactive  POCT urinalysis dipstick     Status: None   Collection Time: 03/24/18  5:47 PM  Result Value Ref Range   Color, UA yellow yellow   Clarity, UA clear clear   Glucose, UA negative negative mg/dL   Bilirubin, UA negative negative   Ketones, POC UA negative negative mg/dL   Spec Grav, UA 1.020 1.010 - 1.025  Blood, UA negative negative   pH, UA 7.5 5.0 - 8.0   Protein Ur, POC negative negative mg/dL   Urobilinogen, UA 1.0 0.2 or 1.0 E.U./dL   Nitrite, UA Negative Negative   Leukocytes, UA Negative Negative     Assessment and Plan :   Urinary frequency  Follow-up visit for lab results.  Patient has no concerns.  Provided with copy of his results and reported them as all normal to patient.  Counseled the PA English well contact patient if anything further is required.  Jaynee Eagles, PA-C [facility] [phone] 03/25/2018 5:59 PM

## 2018-03-26 ENCOUNTER — Encounter: Payer: Self-pay | Admitting: Physician Assistant

## 2018-03-27 ENCOUNTER — Encounter: Payer: Self-pay | Admitting: Physician Assistant

## 2018-03-27 NOTE — Progress Notes (Signed)
Lab letter

## 2018-07-22 ENCOUNTER — Encounter: Payer: Self-pay | Admitting: Physician Assistant

## 2018-07-22 ENCOUNTER — Ambulatory Visit (INDEPENDENT_AMBULATORY_CARE_PROVIDER_SITE_OTHER): Payer: 59 | Admitting: Physician Assistant

## 2018-07-22 ENCOUNTER — Other Ambulatory Visit: Payer: Self-pay

## 2018-07-22 VITALS — BP 132/76 | HR 51 | Temp 98.6°F | Resp 16 | Ht 69.0 in | Wt 198.6 lb

## 2018-07-22 DIAGNOSIS — H6123 Impacted cerumen, bilateral: Secondary | ICD-10-CM

## 2018-07-22 NOTE — Patient Instructions (Signed)
° ° ° °  If you have lab work done today you will be contacted with your lab results within the next 2 weeks.  If you have not heard from us then please contact us. The fastest way to get your results is to register for My Chart. ° ° °IF you received an x-ray today, you will receive an invoice from [facility]. Please contact [facility] at [phone] with questions or concerns regarding your invoice.  ° °IF you received labwork today, you will receive an invoice from [facility]. Please contact [facility] at [phone] with questions or concerns regarding your invoice.  ° °Our billing staff will not be able to assist you with questions regarding bills from these companies. ° °You will be contacted with the lab results as soon as they are available. The fastest way to get your results is to activate your My Chart account. Instructions are located on the last page of this paperwork. If you have not heard from us regarding the results in 2 weeks, please contact this office. °  ° ° ° °

## 2018-07-22 NOTE — Progress Notes (Signed)
    07/22/2018 4:35 PM   DOB: 07-Oct-1996 / MRN: [id number]  SUBJECTIVE:  Ricardo Parker is a 22 y.o. male presenting for bilateral hearing loss and ear pain. Symptoms present for about 4 days.  The problem is worsening. He has tried nothing.   He has No Known Allergies.   He  has a past medical history of Autistic spectrum disorder, Renal stone (2010), and Wears glasses.    He  reports that he has never smoked. He has never used smokeless tobacco. He reports that he does not drink alcohol or use drugs. He  has no sexual activity history on file. The patient  has no past surgical history on file.  His family history is not on file.  Review of Systems  HENT: Positive for ear pain and hearing loss.     The problem list and medications were reviewed and updated by myself where necessary and exist elsewhere in the encounter.   OBJECTIVE:  BP 132/76   Pulse (!) 51   Temp 98.6 F (37 C)   Resp 16   Ht 5\' 9"  (1.753 m)   Wt 198 lb 9.6 oz (90.1 kg)   SpO2 98%   BMI 29.33 kg/m   Wt Readings from Last 3 Encounters:  07/22/18 198 lb 9.6 oz (90.1 kg)  03/24/18 194 lb (88 kg)  01/18/16 180 lb 9.6 oz (81.9 kg)   Temp Readings from Last 3 Encounters:  07/22/18 98.6 F (37 C)  03/24/18 98.1 F (36.7 C) (Oral)  01/18/16 98.1 F (36.7 C) (Oral)   BP Readings from Last 3 Encounters:  07/22/18 132/76  03/24/18 (!) 146/78  01/18/16 122/78   Pulse Readings from Last 3 Encounters:  07/22/18 (!) 51  03/25/18 70  03/24/18 89    Physical Exam  HENT:  Right Ear: Tympanic membrane normal.  Left Ear: Tympanic membrane normal.  Ears:  Vitals reviewed.   No results found for: HGBA1C  Lab Results  Component Value Date   WBC 7.6 03/24/2018   HGB 15.2 03/24/2018   HCT 44.7 03/24/2018   MCV 82 03/24/2018   PLT 267 03/24/2018    Lab Results  Component Value Date   CREATININE 1.07 03/24/2018   BUN 10 03/24/2018   NA 141 03/24/2018   K 3.9 03/24/2018   CL 99 03/24/2018   CO2 23 03/24/2018    Lab Results  Component Value Date   ALT 38 03/24/2018   AST 28 03/24/2018   ALKPHOS 73 03/24/2018   BILITOT 0.3 03/24/2018    Lab Results  Component Value Date   TSH 4.020 03/24/2018    Lab Results  Component Value Date   CHOL 153 01/18/2016   HDL 35.90 (L) 01/18/2016   LDLCALC 97 01/18/2016   TRIG 103.0 01/18/2016   CHOLHDL 4 01/18/2016     ASSESSMENT AND PLAN:  Ricardo Parker was seen today for otalgia.  Diagnoses and all orders for this visit:  Bilateral impacted cerumen Comments: Pt asymptomatic post lavage.  Edu provided.     The patient is advised to call or return to clinic if he does not see an improvement in symptoms, or to seek the care of the closest emergency department if he worsens with the above plan.   Deliah BostonMichael Clark, MHS, PA-C [facility] 07/22/2018 4:35 PM
